# Patient Record
Sex: Female | Born: 1970 | Hispanic: Yes | Marital: Married | Smoking: Never smoker
Health system: Southern US, Community
[De-identification: ages and names within clinical notes are randomized; demographics above are authoritative.]

---

## 2004-02-05 ENCOUNTER — Ambulatory Visit: Payer: Self-pay | Admitting: Family Medicine

## 2011-06-29 ENCOUNTER — Ambulatory Visit: Payer: Self-pay | Admitting: Primary Care

## 2012-12-07 IMAGING — CR DG LUMBAR SPINE 2-3V
1 series · 3 of 3 positions shown · non-contrast
Comparison: none

REASON FOR EXAM: Left Sciatica, low back and left leg pain
COMMENTS:

PROCEDURE:     DXR - DXR LUMBAR SPINE AP AND LATERAL  - June 29, 2011  [DATE]
RESULT:     Comparison: None

[Series 1: ap · 0.17mm/px · 3 of 3 slices shown]
[im 1/3]
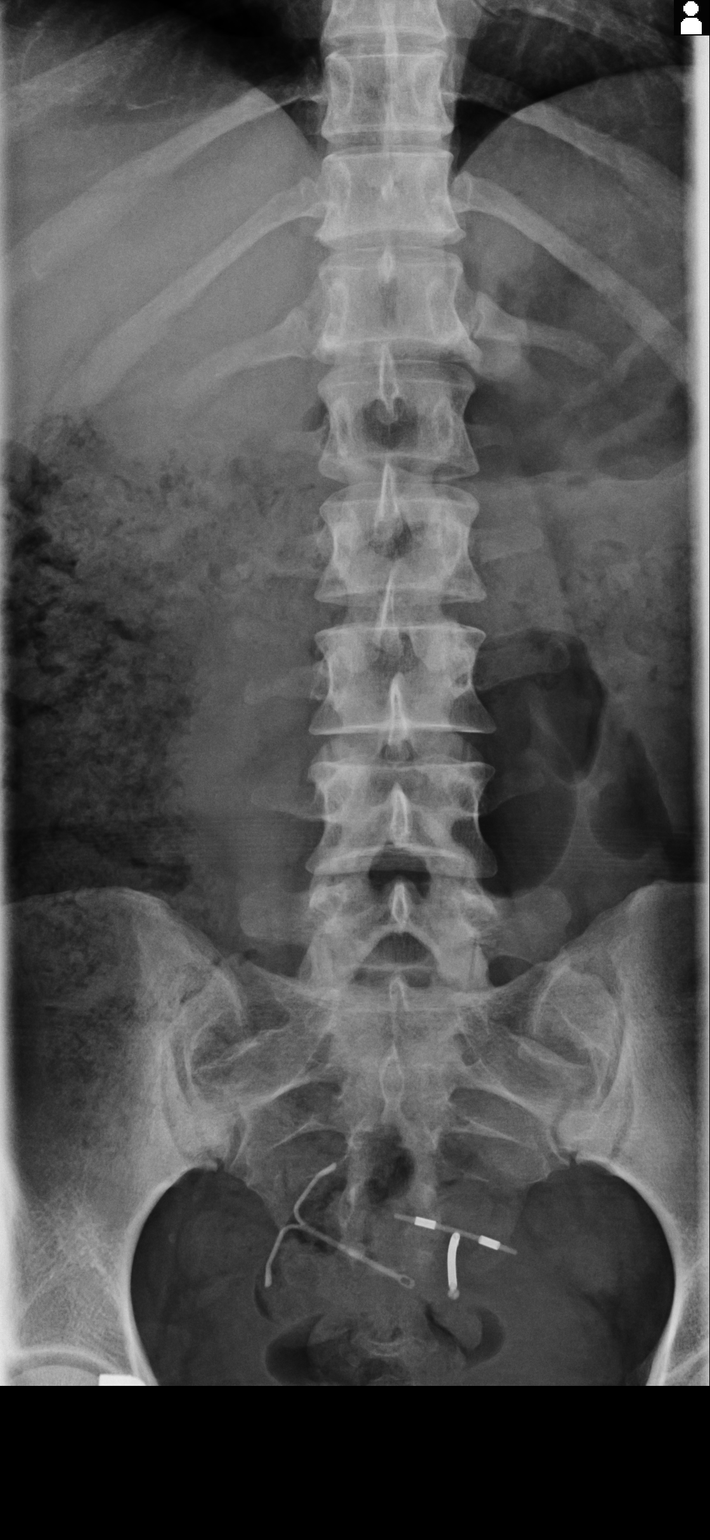
[im 2/3]
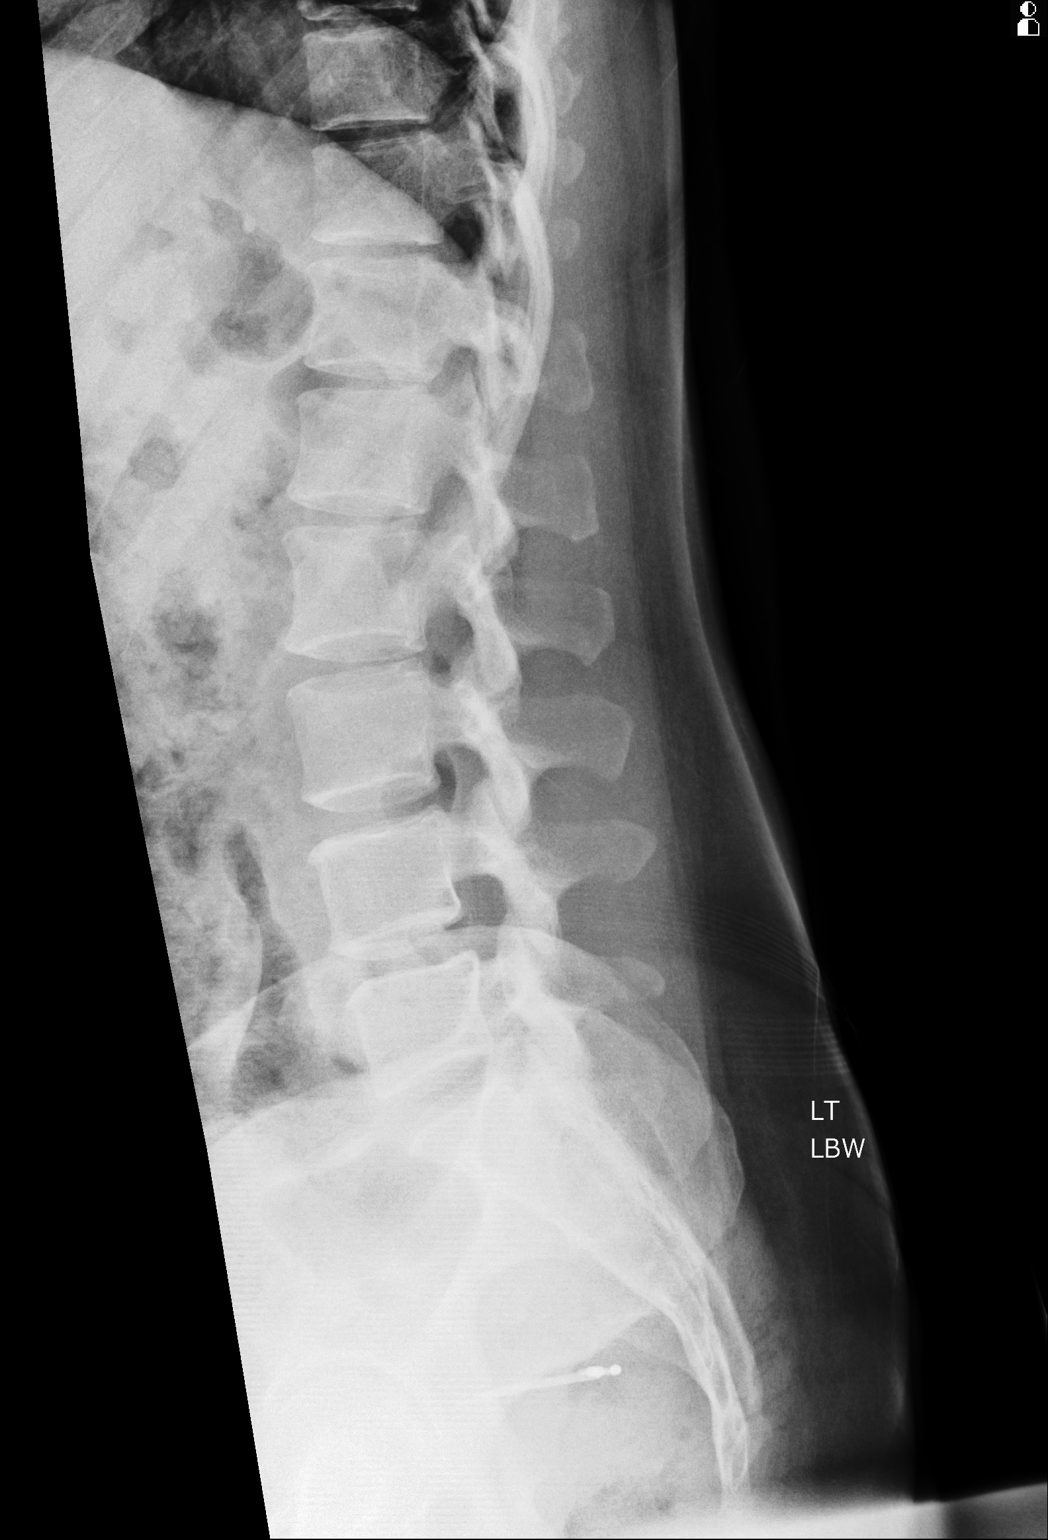
[im 3/3]
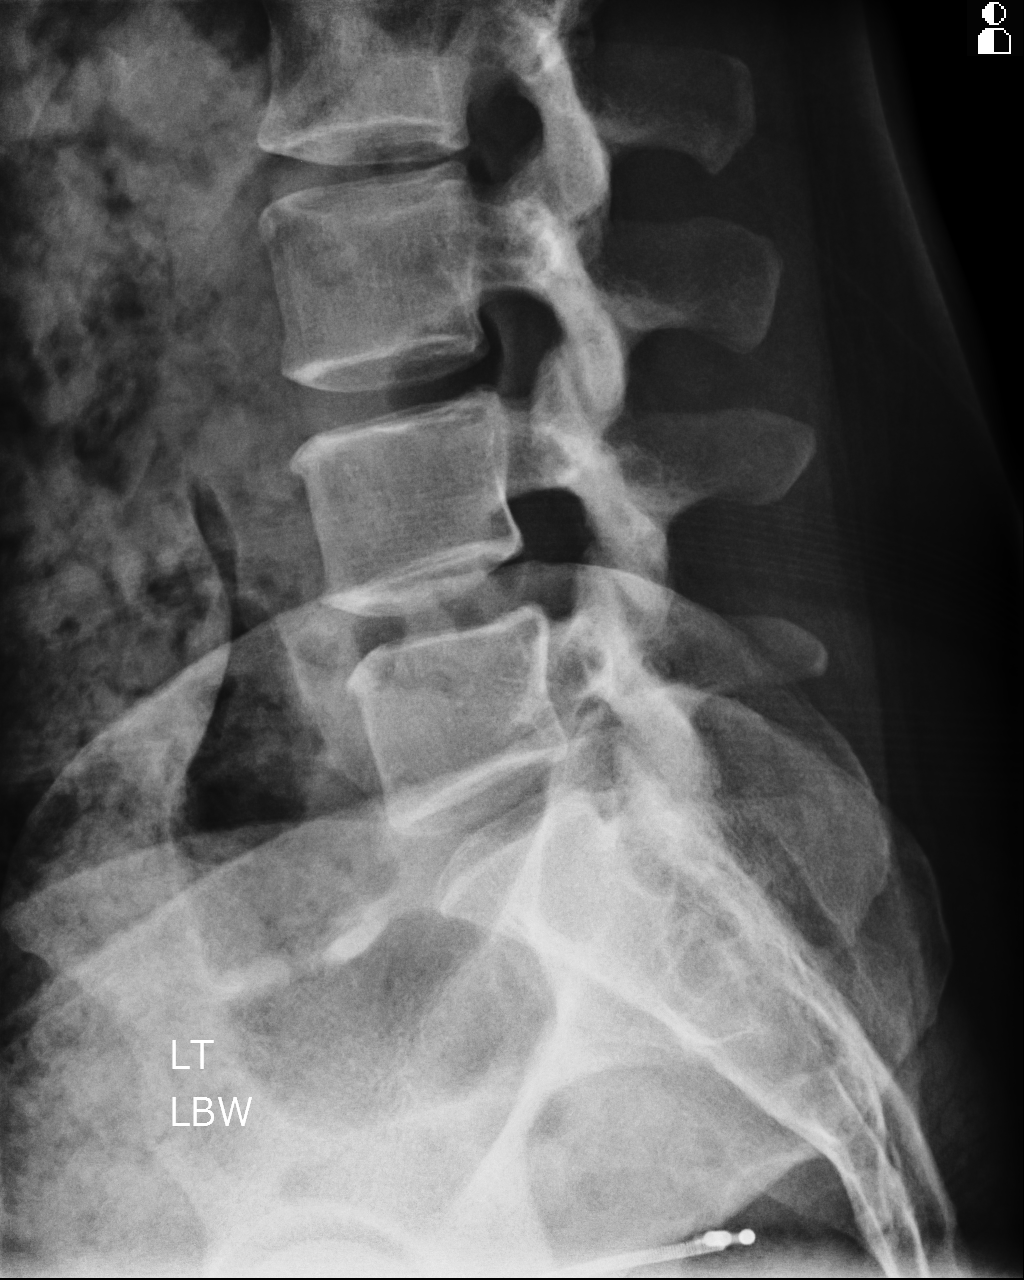

[3 of 3 positions shown; findings below may reference images not displayed]

FINDINGS: AP and lateral views of the lumbar spine and a coned down view of the
lumbosacral junction are provided.

There are 5 nonrib bearing lumbar-type vertebral bodies. The vertebral body
heights are maintained. The alignment is anatomic. There is no
spondylolysis. There is no acute fracture or static listhesis. The disc
spaces are maintained.

The SI joints are unremarkable.

Two intrauterine device is are noted. There is one T type IUD noted along
the superior slightly lateral aspect of the pelvis projecting over the upper
right sacrum which appears malpositioned. The second more lower centrally
located T-type IUD is in the expected location for an IUD. The upper most
device may be malpositioned within the uterine cavity versus perforated
through the uterus. Recommend OB/GYN consultation.
IMPRESSION: 1. No acute osseous abnormality of the lumbar spine.

2. Two intrauterine device is are noted. There is one T type IUD noted along
the superior slightly lateral aspect of the pelvis projecting over the upper
right sacrum which appears malpositioned. The second more lower centrally
located T-type IUD is in the expected location for an IUD. The upper most
device may be malpositioned within the uterine cavity versus perforated
through the uterus. Recommend OB/GYN consultation.

## 2018-01-17 ENCOUNTER — Telehealth: Payer: Self-pay | Admitting: Obstetrics & Gynecology

## 2018-01-17 NOTE — Telephone Encounter (Signed)
Angelica Jenkins referring for Mechanical complication of interuterine contraceptive device. Called and left voicemail for patient to call back to be schedule.. Paper records. Pap smear 11/16/17.

## 2018-01-18 NOTE — Telephone Encounter (Signed)
Called and left voicemail with Interpreter services for patient to call back to be schedule

## 2018-01-22 NOTE — Telephone Encounter (Signed)
Called and left voicemail with Interpre[employer] services for patient to call back to be schedule. Called and spoke with Sheralyn Boatman at [facility] to notify her  Multiple  Attempts to reach patient was unsuccessful. Letter to be sent

## 2018-01-30 ENCOUNTER — Encounter: Payer: Self-pay | Admitting: Obstetrics & Gynecology

## 2018-01-30 ENCOUNTER — Ambulatory Visit (INDEPENDENT_AMBULATORY_CARE_PROVIDER_SITE_OTHER): Payer: BLUE CROSS/BLUE SHIELD

## 2018-01-30 ENCOUNTER — Ambulatory Visit: Payer: BLUE CROSS/BLUE SHIELD | Admitting: Obstetrics & Gynecology

## 2018-01-30 VITALS — BP 100/60 | Ht 60.0 in | Wt 118.0 lb

## 2018-01-30 DIAGNOSIS — Z3009 Encounter for other general counseling and advice on contraception: Secondary | ICD-10-CM

## 2018-01-30 DIAGNOSIS — T8332XS Displacement of intrauterine contraceptive device, sequela: Secondary | ICD-10-CM

## 2018-01-30 NOTE — Progress Notes (Signed)
PRE-OPERATIVE HISTORY AND PHYSICAL EXAM  HPI:  Angelica Jenkins is a 47 y.o. [id number] No LMP recorded.; she is being admitted for surgery related to requested sterilization.  Pt has a retained piece of a Paraguard copper IUD within the lining of the uterus after incomplete removal at PCP office.  She desires no future pregnancies.  PMHx: History reviewed. No pertinent past medical history. History reviewed. No pertinent surgical history. History reviewed. No pertinent family history. Social History   Tobacco Use  . Smoking status: Never Smoker  . Smokeless tobacco: Never Used  Substance Use Topics  . Alcohol use: Yes  . Drug use: Never   No current outpatient medications on file. Allergies: Patient has no known allergies.  Review of Systems  All other systems reviewed and are negative.   Objective: BP 100/60   Ht 5' (1.524 m)   Wt 118 lb (53.5 kg)   BMI 23.05 kg/m   Filed Weights   01/30/18 1355  Weight: 118 lb (53.5 kg)   Physical Exam  Constitutional: She is oriented to person, place, and time. She appears well-developed and well-nourished. No distress.  HENT:  Head: Normocephalic and atraumatic. Head is without laceration.  Right Ear: Hearing normal.  Left Ear: Hearing normal.  Nose: No epistaxis.  No foreign bodies.  Mouth/Throat: Uvula is midline, oropharynx is clear and moist and mucous membranes are normal.  Eyes: Pupils are equal, round, and reactive to light.  Neck: Normal range of motion. Neck supple. No thyromegaly present.  Cardiovascular: Normal rate and regular rhythm. Exam reveals no gallop and no friction rub.  No murmur heard. Pulmonary/Chest: Effort normal and breath sounds normal. No respiratory distress. She has no wheezes. Right breast exhibits no mass, no skin change and no tenderness. Left breast exhibits no mass, no skin change and no tenderness.  Abdominal: Soft. Bowel sounds are normal. She exhibits no distension. There is no tenderness.  There is no rebound.  Musculoskeletal: Normal range of motion.  Neurological: She is alert and oriented to person, place, and time. No cranial nerve deficit.  Skin: Skin is warm and dry.  Psychiatric: She has a normal mood and affect. Judgment normal.  Vitals reviewed.   Assessment: 1. Malpositioned IUD, sequela   2. Sterilization consult   Plan- removal by hysteroscopy of piece of IUD Also- plan laparoscopy salpingectomies for sterility  I have had a careful discussion with this patient about all the options available and the risk/benefits of each. I have fully informed this patient that surgery may subject her to a variety of discomforts and risks: She understands that most patients have surgery with little difficulty, but problems can happen ranging from minor to fatal. These include nausea, vomiting, pain, bleeding, infection, poor healing, hernia, or formation of adhesions. Unexpected reactions may occur from any drug or anesthetic given. Unintended injury may occur to other pelvic or abdominal structures such as Fallopian tubes, ovaries, bladder, ureter (tube from kidney to bladder), or bowel. Nerves going from the pelvis to the legs may be injured. Any such injury may require immediate or later additional surgery to correct the problem. Excessive blood loss requiring transfusion is very unlikely but possible. Dangerous blood clots may form in the legs or lungs. Physical and sexual activity will be restricted in varying degrees for an indeterminate period of time but most often 2-6 weeks.  Finally, she understands that it is impossible to list every possible undesirable effect and that the condition for which surgery  is done is not always cured or significantly improved, and in rare cases may be even worse.Ample time was given to answer all questions.  The patient has been fully informed about all methods of contraception, both temporary and permanent. She understands that tubal ligation is  meant to be permanent, absolute and irreversible. She was told that there is an approximately 1 in 400 chance of a pregnancy in the future after tubal ligation. She was told the short and long term complications of tubal ligation. She understands the risks from this surgery include, but are not limited to, the risks of anesthesia, hemorrhage, infection, perforation, and injury to adjacent structures, bowel, bladder and blood vessels.    Annamarie Major, MD, [facility], [facility] 01/30/2018  4:59 PM

## 2018-01-30 NOTE — Progress Notes (Signed)
Reason for consult: IUD Malposition and Incomplete removal Consultant: Sandrea Hughs, FNP ([facility])  Contraception Concern Patient presents for contraception counseling. The patient has no complaints today. The patient is sexually active. Pertinent past medical history: none.   Pt had Paraguard for 10 years.  PCP retrieved/removed IUD but had appearance of breaking off or incomplete removal.  No bleeding or pain.  She has reg cycles usually.  Pt does not desire any further pregnancies.  PMHx: She  has no past medical history on file. Also,  has no past surgical history on file., family history is not on file.,  reports that she has never smoked. She has never used smokeless tobacco. She reports that she drinks alcohol. She reports that she does not use drugs.  She currently has no medications in their medication list. Also, has No Known Allergies.  Review of Systems  Constitutional: Negative for chills, fever and malaise/fatigue.  HENT: Negative for congestion, sinus pain and sore throat.   Eyes: Negative for blurred vision and pain.  Respiratory: Negative for cough and wheezing.   Cardiovascular: Negative for chest pain and leg swelling.  Gastrointestinal: Negative for abdominal pain, constipation, diarrhea, heartburn, nausea and vomiting.  Genitourinary: Negative for dysuria, frequency, hematuria and urgency.  Musculoskeletal: Negative for back pain, joint pain, myalgias and neck pain.  Skin: Negative for itching and rash.  Neurological: Negative for dizziness, tremors and weakness.  Endo/Heme/Allergies: Does not bruise/bleed easily.  Psychiatric/Behavioral: Negative for depression. The patient is not nervous/anxious and does not have insomnia.     Objective: BP 100/60   Ht 5' (1.524 m)   Wt 118 lb (53.5 kg)   BMI 23.05 kg/m  Physical Exam  Constitutional: She is oriented to person, place, and time. She appears well-developed and well-nourished. No  distress.  Musculoskeletal: Normal range of motion.  Neurological: She is alert and oriented to person, place, and time.  Skin: Skin is warm and dry.  Psychiatric: She has a normal mood and affect.  Vitals reviewed.  ASSESSMENT/PLAN:   Problem List Items Addressed This Visit      Genitourinary   Malpositioned IUD, sequela - Primary   Relevant Orders   US PELVIS TRANSVANGINAL NON-OB (TV ONLY)    Review of ULTRASOUND.    I have personally reviewed images and report of recent ultrasound done at [facility].    Plan of management to be discussed with patient.    Evidence for piece of IUD retained in lining of uterus  Plan- hysteroscopy to remove this part of the iUD Options for future contraception discussed Pt prefers BTL.  Can do at time of surgery above.  Pros and cons discussed  Interpreter present for all discussions  Annamarie Major, MD, [facility], [facility] 01/30/2018  4:56 PM

## 2018-01-31 ENCOUNTER — Telehealth: Payer: Self-pay | Admitting: Obstetrics & Gynecology

## 2018-01-31 NOTE — Telephone Encounter (Signed)
Contact the patient via [facility], [phone]. Patient is aware of Pre-admit Testing appointment tomorrow, 02/01/18 @ 8:45am, and OR on 02/06/18. Patient was concerned about the appointment time, as she has to take her daughter to school, but per [facility] @ Pre-admit, this is the only available appointment and the patient should arrive as close to 8:45 as possible, that it's okay if she is a little late.

## 2018-01-31 NOTE — Telephone Encounter (Signed)
Patient is requesting an interpreter and to speak with Harriett Sine. Please call patient. Thank you

## 2018-01-31 NOTE — Telephone Encounter (Signed)
Attempted to reach the patient via [facility], [phone], who spoke with someone at the patient's phone# who gave another phone#, [phone]. Clifton Custard tried that phone# and l/m for the patient to return my call.

## 2018-02-01 ENCOUNTER — Encounter
Admission: RE | Admit: 2018-02-01 | Discharge: 2018-02-01 | Disposition: A | Discharge status: Home / Self Care | Payer: BLUE CROSS/BLUE SHIELD | Source: Ambulatory Visit | Attending: Obstetrics & Gynecology | Admitting: Obstetrics & Gynecology

## 2018-02-06 ENCOUNTER — Encounter: Admission: RE | Payer: Self-pay | Source: Ambulatory Visit

## 2018-02-06 ENCOUNTER — Ambulatory Visit
Admission: RE | Admit: 2018-02-06 | Payer: BLUE CROSS/BLUE SHIELD | Source: Ambulatory Visit | Admitting: Obstetrics & Gynecology

## 2018-02-06 SURGERY — SALPINGECTOMY, BILATERAL, LAPAROSCOPIC
Anesthesia: Choice

## 2022-04-27 ENCOUNTER — Other Ambulatory Visit: Payer: Self-pay

## 2022-04-27 DIAGNOSIS — Z1231 Encounter for screening mammogram for malignant neoplasm of breast: Secondary | ICD-10-CM

## 2022-06-01 ENCOUNTER — Ambulatory Visit: Payer: 59 | Attending: Cardiology | Admitting: Cardiology

## 2022-06-01 ENCOUNTER — Encounter: Payer: Self-pay | Admitting: Cardiology

## 2022-06-01 VITALS — BP 104/72 | HR 60 | Ht 60.0 in | Wt 123.8 lb

## 2022-06-01 DIAGNOSIS — R072 Precordial pain: Secondary | ICD-10-CM | POA: Diagnosis not present

## 2022-06-01 DIAGNOSIS — R0602 Shortness of breath: Secondary | ICD-10-CM | POA: Diagnosis not present

## 2022-06-01 NOTE — Progress Notes (Signed)
Cardiology Office Note:    Date:  06/01/2022   ID:  Angelica Jenkins, DOB 11-Apr-1971, MRN [id number]  PCP:  Patient, No Pcp Per   [place] Prov[facility] Cardiologist:  Kate Sable, MD     Referring MD: Freddy Finner, NP   Chief Complaint  Patient presents with   New Patient (Initial Visit)    Chest pain, SOB, No Hx, neck and right shoulder pain   Angelica Jenkins is a 52 y.o. female who is being seen today for the evaluation of chest pain at the request of Freddy Finner, NP.   History of Present Illness:    Angelica Jenkins is a 52 y.o. female with no significant past medical history who presents due to chest pain and shortness of breath.  Patient states having episodic symptoms of shortness of breath about 3 months ago.  Has not had any symptoms since.  She also had some chest discomfort while walking at work.  Denies any symptoms since.  Denies any family history of heart attacks.  Denies smoking.  Feels well, no concerns at this time.  No past medical history on file.  No past surgical history on file.  Current Medications: Current Meds  Medication Sig   acyclovir (ZOVIRAX) 400 MG tablet Take 400 mg by mouth as needed.   valACYclovir (VALTREX) 1000 MG tablet Take 1,000 mg by mouth daily as needed.   Vitamin D, Ergocalciferol, (DRISDOL) 1.25 MG (50000 UNIT) CAPS capsule Take 50,000 Units by mouth once a week.     Allergies:   Patient has no known allergies.   Social History   Socioeconomic History   Marital status: Married    Spouse name: Not on file   Number of children: Not on file   Years of education: Not on file   Highest education level: Not on file  Occupational History   Not on file  Tobacco Use   Smoking status: Never   Smokeless tobacco: Never  Vaping Use   Vaping Use: Never used  Substance and Sexual Activity   Alcohol use: Yes    Comment: occasional   Drug use: Never   Sexual activity: Yes    Birth control/protection: I.U.D.  Other  Topics Concern   Not on file  Social History Narrative   Not on file   Social Determinants of Health   Financial Resource Strain: Not on file  Food Insecurity: Not on file  Transportation Needs: Not on file  Physical Activity: Not on file  Stress: Not on file  Social Connections: Not on file     Family History: The patient's family history is not on file.  ROS:   Please see the history of present illness.     All other systems reviewed and are negative.  EKGs/Labs/Other Studies Reviewed:    The following studies were reviewed today:   EKG:  EKG is  ordered today.  The ekg ordered today demonstrates normal sinus rhythm, normal ECG  Recent Labs: No results found for requested labs within last 365 days.  Recent Lipid Panel No results found for: "CHOL", "TRIG", "HDL", "CHOLHDL", "VLDL", "LDLCALC", "LDLDIRECT"   Risk Assessment/Calculations:             Physical Exam:    VS:  BP 104/72 (BP Location: Right Arm)   Pulse 60   Ht 5' (1.524 m)   Wt 123 lb 12.8 oz (56.2 kg)   SpO2 98%   BMI 24.18 kg/m     Wt Readings from  Last 3 Encounters:  06/01/22 123 lb 12.8 oz (56.2 kg)  01/30/18 118 lb (53.5 kg)     GEN:  Well nourished, well developed in no acute distress HEENT: Normal NECK: No JVD; No carotid bruits CARDIAC: RRR, no murmurs, rubs, gallops RESPIRATORY:  Clear to auscultation without rales, wheezing or rhonchi  ABDOMEN: Soft, non-tender, non-distended MUSCULOSKELETAL:  No edema; No deformity  SKIN: Warm and dry NEUROLOGIC:  Alert and oriented x 3 PSYCHIATRIC:  Normal affect   ASSESSMENT:    1. Precordial pain   2. SOB (shortness of breath)    PLAN:    In order of problems listed above:  Chest pain, does not sound like angina.  Has not had any symptoms over the past 3 months.  Obtain echo to rule out any structural abnormalities.  Consider additional testing if symptoms return or echo shows wall motion abnormalities. Shortness of breath, currently  resolved with no symptoms over the past 4 months.  Echo as above.  Follow-up after echocardiogram, consider as needed follow-up if echo is otherwise normal.      Medication Adjustments/Labs and Tests Ordered: Current medicines are reviewed at length with the patient today.  Concerns regarding medicines are outlined above.  Orders Placed This Encounter  Procedures   EKG 12-Lead   ECHOCARDIOGRAM COMPLETE   No orders of the defined types were placed in this encounter.   Patient Instructions  Medication Instructions:   Your physician recommends that you continue on your current medications as directed. Please refer to the Current Medication list given to you today.  *If you need a refill on your cardiac medications before your next appointment, please call your pharmacy*   Lab Work:  None Ordered  If you have labs (blood work) drawn today and your tests are completely normal, you will receive your results only by: [place] (if you have MyChart) OR A paper copy in the mail If you have any lab test that is abnormal or we need to change your treatment, we will call you to review the results.   Testing/Procedures:  Your physician has requested that you have an echocardiogram. Echocardiography is a painless test that uses sound waves to create images of your heart. It provides your doctor with information about the size and shape of your heart and how well your heart's chambers and valves are working. This procedure takes approximately one hour. There are no restrictions for this procedure. Please do NOT wear cologne, perfume, aftershave, or lotions (deodorant is allowed). Please arrive 15 minutes prior to your appointment time.    Follow-Up: At [facility], you and your health needs are our priority.  As part of our continuing mission to provide you with exceptional heart care, we have created designated Provider Care Teams.  These Care Teams include your primary  Cardiologist (physician) and Advanced Practice Providers (APPs -  Physician Assistants and Nurse Practitioners) who all work together to provide you with the care you need, when you need it.  We recommend signing up for the patient portal called "MyChart".  Sign up information is provided on this After Visit Summary.  MyChart is used to connect with patients for Virtual Visits (Telemedicine).  Patients are able to view lab/test results, encounter notes, upcoming appointments, etc.  Non-urgent messages can be sent to your provider as well.   To learn more about what you can do with MyChart, go to [email or web address].    Your next appointment:    Will depend on  Echocardiogram results      Signed, Kate Sable, MD  06/01/2022 10:23 AM    [place]

## 2022-06-01 NOTE — Patient Instructions (Signed)
Medication Instructions:   Your physician recommends that you continue on your current medications as directed. Please refer to the Current Medication list given to you today.  *If you need a refill on your cardiac medications before your next appointment, please call your pharmacy*   Lab Work:  None Ordered  If you have labs (blood work) drawn today and your tests are completely normal, you will receive your results only by: [place] (if you have MyChart) OR A paper copy in the mail If you have any lab test that is abnormal or we need to change your treatment, we will call you to review the results.   Testing/Procedures:  Your physician has requested that you have an echocardiogram. Echocardiography is a painless test that uses sound waves to create images of your heart. It provides your doctor with information about the size and shape of your heart and how well your heart's chambers and valves are working. This procedure takes approximately one hour. There are no restrictions for this procedure. Please do NOT wear cologne, perfume, aftershave, or lotions (deodorant is allowed). Please arrive 15 minutes prior to your appointment time.    Follow-Up: At [facility], you and your health needs are our priority.  As part of our continuing mission to provide you with exceptional heart care, we have created designated Provider Care Teams.  These Care Teams include your primary Cardiologist (physician) and Advanced Practice Providers (APPs -  Physician Assistants and Nurse Practitioners) who all work together to provide you with the care you need, when you need it.  We recommend signing up for the patient portal called "MyChart".  Sign up information is provided on this After Visit Summary.  MyChart is used to connect with patients for Virtual Visits (Telemedicine).  Patients are able to view lab/test results, encounter notes, upcoming appointments, etc.  Non-urgent messages can  be sent to your provider as well.   To learn more about what you can do with MyChart, go to [email or web address].    Your next appointment:    Will depend on Echocardiogram results

## 2022-07-15 ENCOUNTER — Ambulatory Visit: Payer: 59 | Attending: Cardiology

## 2022-07-15 DIAGNOSIS — R0602 Shortness of breath: Secondary | ICD-10-CM | POA: Diagnosis not present

## 2022-07-15 DIAGNOSIS — R072 Precordial pain: Secondary | ICD-10-CM | POA: Diagnosis not present

## 2022-07-15 LAB — ECHOCARDIOGRAM COMPLETE
AR max vel: 2.19 cm2
AV Area VTI: 2.22 cm2
AV Area mean vel: 2.11 cm2
AV Mean grad: 3 mmHg
AV Peak grad: 6 mmHg
Ao pk vel: 1.22 m/s
Area-P 1/2: 3.53 cm2
Calc EF: 60.4 %
S' Lateral: 2.2 cm
Single Plane A2C EF: 62.4 %
Single Plane A4C EF: 59.4 %

## 2022-08-23 ENCOUNTER — Encounter: Payer: Self-pay | Admitting: Radiology

## 2022-08-23 ENCOUNTER — Ambulatory Visit
Admission: RE | Admit: 2022-08-23 | Discharge: 2022-08-23 | Disposition: A | Discharge status: Home / Self Care | Payer: 59 | Source: Ambulatory Visit | Attending: Primary Care | Admitting: Primary Care

## 2022-08-23 DIAGNOSIS — Z1231 Encounter for screening mammogram for malignant neoplasm of breast: Secondary | ICD-10-CM | POA: Insufficient documentation
